# Patient Record
Sex: Female | Born: 1970 | Hispanic: No | Marital: Married | Smoking: Never smoker
Health system: Southern US, Community
[De-identification: ages and names within clinical notes are randomized; demographics above are authoritative.]

---

## 1998-07-11 ENCOUNTER — Other Ambulatory Visit: Admission: RE | Admit: 1998-07-11 | Discharge: 1998-07-11 | Payer: Self-pay | Admitting: *Deleted

## 1998-12-09 ENCOUNTER — Inpatient Hospital Stay (HOSPITAL_COMMUNITY): Admission: AD | Admit: 1998-12-09 | Discharge: 1998-12-12 | Payer: Self-pay | Admitting: Obstetrics and Gynecology

## 1999-02-23 ENCOUNTER — Other Ambulatory Visit: Admission: RE | Admit: 1999-02-23 | Discharge: 1999-02-23 | Payer: Self-pay | Admitting: *Deleted

## 2001-06-07 ENCOUNTER — Other Ambulatory Visit: Admission: RE | Admit: 2001-06-07 | Discharge: 2001-06-07 | Payer: Self-pay | Admitting: *Deleted

## 2005-12-08 ENCOUNTER — Other Ambulatory Visit: Admission: RE | Admit: 2005-12-08 | Discharge: 2005-12-08 | Payer: Self-pay | Admitting: Family Medicine

## 2005-12-08 ENCOUNTER — Ambulatory Visit: Payer: Self-pay | Admitting: Family Medicine

## 2016-12-07 ENCOUNTER — Ambulatory Visit: Payer: Self-pay

## 2016-12-07 ENCOUNTER — Ambulatory Visit (INDEPENDENT_AMBULATORY_CARE_PROVIDER_SITE_OTHER): Payer: Self-pay

## 2016-12-07 ENCOUNTER — Encounter: Payer: Self-pay | Admitting: Sports Medicine

## 2016-12-07 ENCOUNTER — Ambulatory Visit (INDEPENDENT_AMBULATORY_CARE_PROVIDER_SITE_OTHER): Payer: Self-pay | Admitting: Sports Medicine

## 2016-12-07 VITALS — BP 130/86 | HR 73 | Ht 61.0 in | Wt 167.4 lb

## 2016-12-07 DIAGNOSIS — M25561 Pain in right knee: Secondary | ICD-10-CM

## 2016-12-07 DIAGNOSIS — M25461 Effusion, right knee: Secondary | ICD-10-CM

## 2016-12-07 NOTE — Assessment & Plan Note (Signed)
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  Given the effusion aspiration injection performed today.  If any lack of improvement consider further evaluation with MRI but believe this is to be more of a degenerative meniscal issue instead of an acute bucket-handle type tear.   She should continue with compression which she is already using  Neuromuscular reeducation exercises including hip abduction and quad strengthening for improved biomechanics were reviewed in detail with athletic training staff today.  +++++++++++++++++++++++++++++++++++++++++++++++++++++++++++++++ PROCEDURE NOTE: THERAPEUTIC EXERCISES (97110) 15 minutes spent for Therapeutic exercises as stated in above notes.  This included exercises focusing on stretching, strengthening, with significant focus on eccentric aspects.   Proper technique shown and discussed handout in great detail with ATC.  All questions were discussed and answered.

## 2016-12-07 NOTE — Progress Notes (Signed)
OFFICE VISIT NOTE Melissa FellsMichael D. Delorise Shinerigby, DO  [facility] [phone]  Melissa JordanSu Y Mckenzie - 46 y.o. female MRN [id number]  Date of birth: 03/03/1971  Visit Date: 12/07/2016  PCP: No primary care provider on file.   Referred by: No ref. provider found  Orlie DakinBrandy Shelton, CMA acting as scribe for Dr. Berline Choughigby.  SUBJECTIVE:   Chief Complaint  Patient presents with  . pain in right knee   HPI: As below and per problem based documentation when appropriate.  Pt presents today with complaint of right knee pain.  She has been referred here by Dr. Darrick Pennaamien Rodolfo due to ongoing symptoms that are not improving with conservative measures. Pain started about 1.5 years ago. The pain resolved some about 6 months later but hasn't completely gone away.  Injury occurred after a fall while playing tennis.  She reports landing directly on the anterior aspect of her knee.  The pain is described as stiffness and minimal pain normally. There is more pain after exercise and going down stairs. Pain is rated as 5/10.  She does report occasionally the knee will buckle while going down steps she has not had any falls associated with this.  Occasional clicking but no locking  Worsened with going down stairs. Pt is unable to straighten the leg completely or bend the knee completely back. Pt has the most pain after a long game of tennis or doing a lot of lateral movement. There is also significant pain after doing squats.  Improves with icing the knee.  Therapies tried include : icing and stretching  Other associated symptoms include: Pt denies pain in ankle, leg, hips.  Pt denies fever, chills, night sweats, unintentional weight loss or gain.     Review of Systems  Constitutional: Negative for chills and fever.  Respiratory: Negative for shortness of breath and wheezing.   Cardiovascular: Negative for chest pain, palpitations and leg swelling.  Musculoskeletal: Negative for  falls.  Neurological: Negative for dizziness, tingling and headaches.  Endo/Heme/Allergies: Does not bruise/bleed easily.    Otherwise per HPI.  HISTORY & PERTINENT PRIOR DATA:  No specialty comments available. She reports that she has never smoked. She has never used smokeless tobacco. No results for input(s): HGBA1C, LABURIC in the last 8760 hours. Medications & Allergies reviewed per EMR Patient Active Problem List   Diagnosis Date Noted  . Right knee pain 12/07/2016  . Effusion of right knee 12/07/2016   History reviewed. No pertinent past medical history. History reviewed. No pertinent family history. Past Surgical History:  Procedure Laterality Date  . CESAREAN SECTION     Social History   Occupational History  . Not on file.   Social History Main Topics  . Smoking status: Never Smoker  . Smokeless tobacco: Never Used  . Alcohol use Not on file  . Drug use: Unknown  . Sexual activity: Not on file    OBJECTIVE:  VS:  HT:5\' 1"  (154.9 cm)   WT:167 lb 6.4 oz (75.9 kg)  BMI:31.7    BP:130/86  HR:73bpm  TEMP: ( )  RESP:97 % EXAM: Findings:  WDWN, NAD, Non-toxic appearing Alert & appropriately interactive Not depressed or anxious appearing No increased work of breathing. Pupils are equal. EOM intact without nystagmus No clubbing or cyanosis of the extremities appreciated No significant rashes/lesions/ulcerations overlying the examined area. DP & PT pulses 2+/4.  No significant pretibial edema. Sensation intact to light touch in lower extremities.  Right Knee:  Overall joint is well aligned, no significant deformity.   Small supraphysiologic effusion with moderate synovitis.   ROM: 3 to 115.   Extensor mechanism intact Small amount of medial joint line pain that is generalized.  Pain with McMurray's localized over the medial joint line..   Stable to varus/valgus strain & anterior/posterior drawer.  Slightly lax Lachman's without a solid endpoint put  constrained knee at baseline.  This is similar to the left knee which is asymptomatic.Marland Kitchen          No results found. 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  Given the effusion aspiration injection performed today.  If any lack of improvement consider further evaluation with MRI but believe this is to be more of a degenerative meniscal issue instead of an acute bucket-handle type tear.   She should continue with compression which she is already using  Neuromuscular reeducation exercises including hip abduction and quad strengthening for improved biomechanics were reviewed in detail with athletic training staff today.  +++++++++++++++++++++++++++++++++++++++++++++++++++++++++++++++ PROCEDURE NOTE: THERAPEUTIC EXERCISES (97110) 15 minutes spent for Therapeutic exercises as stated in above notes.  This included exercises focusing on stretching, strengthening, with significant focus on eccentric aspects.   Proper technique shown and discussed handout in great detail with ATC.  All questions were discussed and answered.        Relevant Orders   DG Knee AP/LAT W/Sunrise Right   US GUIDED NEEDLE PLACEMENT(NO LINKED CHARGES)   Effusion of right knee      Follow-up: Return in about 6 weeks (around 01/18/2017), or if symptoms worsen or fail to improve.   CMA/ATC served as Neurosurgeon during this visit. History, Physical, and Plan performed by medical provider. Documentation and orders reviewed and attested to.      Gaspar Bidding, DO    [facility]

## 2016-12-07 NOTE — Patient Instructions (Signed)
Please perform the exercise program that Fayrene FearingJames has prepared for you and gone over in detail on a daily basis.  In addition to the handout you were provided you can access your program through: [email or web address]   Your unique program code is: [email or web address]

## 2017-01-18 ENCOUNTER — Ambulatory Visit: Payer: Self-pay | Admitting: Sports Medicine

## 2017-01-18 NOTE — Progress Notes (Deleted)
  OFFICE VISIT NOTE Melissa FellsMichael D. Melissa Mckenzie Shinerigby, DO  [facility] [phone]  Melissa JordanSu Y Mckenzie - 46 y.o. female MRN [id number]  Date of birth: [birth year]  Visit Date: 01/18/2017  PCP: No primary care provider on file.   Referred by: No ref. provider found  Melissa DakinBrandy Mckenzie, CMA acting as scribe for Dr. Berline Choughigby.  SUBJECTIVE:  No chief complaint on file.  HPI: As below and per problem based documentation when appropriate.  Pt presents today for 6 week follow-up of right knee pain. Pt has ultrasound  12/07/16 which showed the following:  radio occult degenerative changes as well as degenerative meniscal bulge. Xray was also done 12/07/16 and showed the following:  IMPRESSION: Moderate joint effusion.  No acute bony abnormality. She had aspiration and steroid injection 12/07/16. She was given home exercises and advised to continue using compression around the knee.     ROS  Otherwise per HPI.  HISTORY & PERTINENT PRIOR DATA:  No specialty comments available. She reports that she has never smoked. She has never used smokeless tobacco. No results for input(s): HGBA1C, LABURIC in the last 8760 hours. Medications & Allergies reviewed per EMR Patient Active Problem List   Diagnosis Date Noted  . Right knee pain 12/07/2016  . Effusion of right knee 12/07/2016   No past medical history on file. No family history on file. Past Surgical History:  Procedure Laterality Date  . CESAREAN SECTION     Social History   Occupational History  . Not on file.   Social History Main Topics  . Smoking status: Never Smoker  . Smokeless tobacco: Never Used  . Alcohol use Not on file  . Drug use: Unknown  . Sexual activity: Not on file    OBJECTIVE:  VS:  HT:    WT:   BMI:     BP:   HR: bpm  TEMP: ( )  RESP:  EXAM: No additional findings.    No results found. ASSESSMENT & PLAN:  { }

## 2017-02-11 ENCOUNTER — Telehealth: Payer: Self-pay | Admitting: Sports Medicine

## 2017-02-11 NOTE — Telephone Encounter (Signed)
Patient calling about no show charge for 07/17 date. She never got a reminder call and it's her first no show w/ Berline Choughigby.  Advised charge would be removed.  Ty,  -LL

## 2017-07-14 ENCOUNTER — Ambulatory Visit: Payer: Self-pay | Admitting: Family Medicine

## 2017-07-14 ENCOUNTER — Encounter: Payer: Self-pay | Admitting: Family Medicine

## 2017-07-14 VITALS — BP 118/80 | HR 75 | Temp 97.6°F | Ht 61.0 in | Wt 169.4 lb

## 2017-07-14 DIAGNOSIS — J45909 Unspecified asthma, uncomplicated: Secondary | ICD-10-CM | POA: Insufficient documentation

## 2017-07-14 DIAGNOSIS — J4521 Mild intermittent asthma with (acute) exacerbation: Secondary | ICD-10-CM

## 2017-07-14 MED ORDER — AZITHROMYCIN 250 MG PO TABS: ORAL_TABLET | ORAL | 0 refills | Status: DC

## 2017-07-14 MED ORDER — PREDNISONE 10 MG PO TABS: 10.0000 mg | ORAL_TABLET | Freq: Two times a day (BID) | ORAL | 0 refills | Status: AC

## 2017-07-14 NOTE — Progress Notes (Signed)
Subjective:  Patient ID: Melissa Mckenzie, female    DOB: 1971-03-20  Age: 47 y.o. MRN: [id number]  CC: Establish Care   HPI Melissa Mckenzie presents for a one-week history of a cough that is now retired productive of cream-colored phlegm.  She has felt a little tight and wheezy as well.  She has no history of asthma, fever chills, hemoptysis or weight loss.  She does not smoke and is not exposed to cigarette smoke. She had a similar episode back in November that entirely cleared but also required an antibiotic.  Her husband and children have flulike symptoms that had started 3 days ago.  There is no family history of asthma.  She is otherwise healthy.  She denies reflux symptoms.  They have dogs at home and she does not seem to be sensitive to them. LMP is now x 2 days.   History Melissa Mckenzie has no past medical history on file.   She has a past surgical history that includes Cesarean section.   Her family history is not on file.She reports that  has never smoked. she has never used smokeless tobacco. Her alcohol and drug histories are not on file.  No outpatient medications prior to visit.   No facility-administered medications prior to visit.     ROS Review of Systems  Constitutional: Negative for chills.  HENT: Negative for congestion, postnasal drip, sinus pressure, sinus pain and trouble swallowing.   Eyes: Negative for photophobia and visual disturbance.  Respiratory: Positive for cough and wheezing. Negative for chest tightness and shortness of breath.   Cardiovascular: Negative.   Gastrointestinal: Negative.   Musculoskeletal: Negative for arthralgias and myalgias.  Skin: Negative for pallor and rash.  Neurological: Negative for headaches.  Hematological: Does not bruise/bleed easily.  Psychiatric/Behavioral: Negative.     Objective:  BP 118/80 (BP Location: Right Arm, Patient Position: Sitting, Cuff Size: Normal)   Pulse 75   Temp 97.6 F (36.4 C) (Oral)   Ht 5\' 1"  (1.549 m)   Wt 169 lb  6 oz (76.8 kg)   SpO2 98%   BMI 32.00 kg/m   Physical Exam  Constitutional: She is oriented to person, place, and time. She appears well-developed and well-nourished. No distress.  HENT:  Head: Normocephalic and atraumatic.  Right Ear: External ear normal.  Left Ear: External ear normal.  Mouth/Throat: Oropharynx is clear and moist. No oropharyngeal exudate.  Eyes: Conjunctivae are normal. Pupils are equal, round, and reactive to light. Right eye exhibits no discharge. Left eye exhibits no discharge. No scleral icterus.  Neck: Neck supple. No JVD present. No tracheal deviation present. No thyromegaly present.  Cardiovascular: Normal rate, regular rhythm and normal heart sounds.  Pulmonary/Chest: Effort normal and breath sounds normal. No stridor. No respiratory distress. She has no wheezes. She has no rales.  Lymphadenopathy:    She has no cervical adenopathy.  Neurological: She is alert and oriented to person, place, and time.  Skin: Skin is warm and dry. She is not diaphoretic.  Psychiatric: She has a normal mood and affect. Her behavior is normal.      Assessment & Plan:   Melissa Mckenzie was seen today for establish care.  Diagnoses and all orders for this visit:  Mild intermittent asthmatic bronchitis with acute exacerbation -     azithromycin (ZITHROMAX) 250 MG tablet; Take 2 today and then one each day for 4 more days. -     predniSONE (DELTASONE) 10 MG tablet; Take 1 tablet (10  mg total) by mouth 2 (two) times daily with a meal for 7 days.   I am having Melissa Mckenzie start on azithromycin and predniSONE.  Meds ordered this encounter  Medications  . azithromycin (ZITHROMAX) 250 MG tablet    Sig: Take 2 today and then one each day for 4 more days.    Dispense:  6 tablet    Refill:  0  . predniSONE (DELTASONE) 10 MG tablet    Sig: Take 1 tablet (10 mg total) by mouth 2 (two) times daily with a meal for 7 days.    Dispense:  14 tablet    Refill:  0   She is to follow-up in one  week if not improved.   Follow-up: No Follow-up on file.  Mliss Sax, MD

## 2018-10-23 ENCOUNTER — Telehealth: Payer: Self-pay | Admitting: Family Medicine

## 2018-10-23 NOTE — Telephone Encounter (Signed)
Called pt on behalf of Dr Doreene Burke since it has been a while, She said she will call back at a later date to schedule her annual

## 2021-05-13 ENCOUNTER — Ambulatory Visit: Payer: Self-pay | Admitting: Internal Medicine

## 2021-10-23 ENCOUNTER — Encounter: Payer: Self-pay | Admitting: Internal Medicine

## 2021-10-23 ENCOUNTER — Ambulatory Visit (INDEPENDENT_AMBULATORY_CARE_PROVIDER_SITE_OTHER): Payer: 59 | Admitting: Internal Medicine

## 2021-10-23 VITALS — BP 120/88 | HR 61 | Resp 18 | Ht 62.0 in | Wt 168.6 lb

## 2021-10-23 DIAGNOSIS — Z13220 Encounter for screening for lipoid disorders: Secondary | ICD-10-CM

## 2021-10-23 DIAGNOSIS — Z1211 Encounter for screening for malignant neoplasm of colon: Secondary | ICD-10-CM | POA: Diagnosis not present

## 2021-10-23 DIAGNOSIS — Z0000 Encounter for general adult medical examination without abnormal findings: Secondary | ICD-10-CM | POA: Insufficient documentation

## 2021-10-23 LAB — LIPID PANEL
Cholesterol: 173 mg/dL (ref 0–200)
HDL: 68.9 mg/dL (ref >39.00)
LDL Cholesterol: 80 mg/dL (ref 0–99)
NonHDL: 103.6
Total CHOL/HDL Ratio: 3
Triglycerides: 116 mg/dL (ref 0.0–149.0)
VLDL: 23.2 mg/dL (ref 0.0–40.0)

## 2021-10-23 LAB — COMPREHENSIVE METABOLIC PANEL
ALT: 12 U/L (ref 0–35)
AST: 18 U/L (ref 0–37)
Albumin: 4.4 g/dL (ref 3.5–5.2)
Alkaline Phosphatase: 61 U/L (ref 39–117)
BUN: 17 mg/dL (ref 6–23)
CO2: 25 mEq/L (ref 19–32)
Calcium: 9.4 mg/dL (ref 8.4–10.5)
Chloride: 105 mEq/L (ref 96–112)
Creatinine, Ser: 0.72 mg/dL (ref 0.40–1.20)
GFR: 96.09 mL/min (ref >60.00)
Glucose, Bld: 114 mg/dL — ABNORMAL HIGH (ref 70–99)
Potassium: 3.8 mEq/L (ref 3.5–5.1)
Sodium: 138 mEq/L (ref 135–145)
Total Bilirubin: 0.6 mg/dL (ref 0.2–1.2)
Total Protein: 7.5 g/dL (ref 6.0–8.3)

## 2021-10-23 LAB — CBC
HCT: 39.2 % (ref 36.0–46.0)
Hemoglobin: 13.2 g/dL (ref 12.0–15.0)
MCHC: 33.6 g/dL (ref 30.0–36.0)
MCV: 89.2 fl (ref 78.0–100.0)
Platelets: 256 10*3/uL (ref 150.0–400.0)
RBC: 4.4 Mil/uL (ref 3.87–5.11)
RDW: 13.6 % (ref 11.5–15.5)
WBC: 7.1 10*3/uL (ref 4.0–10.5)

## 2021-10-23 NOTE — Progress Notes (Signed)
? ?  Subjective:  ? ?Patient ID: Melissa Mckenzie, female    DOB: [birth year], 51 y.o.   MRN: [id number] ? ?HPI ?The patient is a new 51 YO female coming in for physical. ? ?PMH, [facility], social history reviewed and updated ? ?Review of Systems  ?Constitutional: Negative.   ?HENT: Negative.    ?Eyes: Negative.   ?Respiratory:  Negative for cough, chest tightness and shortness of breath.   ?Cardiovascular:  Negative for chest pain, palpitations and leg swelling.  ?Gastrointestinal:  Negative for abdominal distention, abdominal pain, constipation, diarrhea, nausea and vomiting.  ?Musculoskeletal: Negative.   ?Skin: Negative.   ?Neurological: Negative.   ?Psychiatric/Behavioral: Negative.    ? ?Objective:  ?Physical Exam ?Constitutional:   ?   Appearance: She is well-developed.  ?HENT:  ?   Head: Normocephalic and atraumatic.  ?Cardiovascular:  ?   Rate and Rhythm: Normal rate and regular rhythm.  ?Pulmonary:  ?   Effort: Pulmonary effort is normal. No respiratory distress.  ?   Breath sounds: Normal breath sounds. No wheezing or rales.  ?Abdominal:  ?   General: Bowel sounds are normal. There is no distension.  ?   Palpations: Abdomen is soft.  ?   Tenderness: There is no abdominal tenderness. There is no rebound.  ?Musculoskeletal:  ?   Cervical back: Normal range of motion.  ?Skin: ?   General: Skin is warm and dry.  ?Neurological:  ?   Mental Status: She is alert and oriented to person, place, and time.  ?   Coordination: Coordination normal.  ? ? ?Vitals:  ? 10/23/21 0854  ?BP: 120/88  ?Pulse: 61  ?Resp: 18  ?SpO2: 98%  ?Weight: 168 lb 9.6 oz (76.5 kg)  ?Height: 5\' 2"  (1.575 m)  ? ? ?This visit occurred during the SARS-CoV-2 public health emergency.  Safety protocols were in place, including screening questions prior to the visit, additional usage of staff PPE, and extensive cleaning of exam room while observing appropriate contact time as indicated for disinfecting solutions.  ? ?Assessment & Plan:  ? ?

## 2021-10-23 NOTE — Assessment & Plan Note (Signed)
Flu shot yearly. Covid-19 counseled. Shingrix counseled wishes to think about it. Tetanus counseled wishes to think about it. Cologuard ordered. Mammogram ordered, pap smear counseled wishes to think about it. Counseled about sun safety and mole surveillance. Counseled about the dangers of distracted driving. Given 10 year screening recommendations.  ? ?

## 2021-10-23 NOTE — Patient Instructions (Addendum)
We will have the colon cancer screening sent to the house. ? ?We will have them call you for a mammogram. ? ?Think about the shingles vaccine and the tetanus vaccine. ? ?You are due for a pap smear.  ?

## 2021-10-26 ENCOUNTER — Other Ambulatory Visit (INDEPENDENT_AMBULATORY_CARE_PROVIDER_SITE_OTHER): Payer: 59

## 2021-10-26 ENCOUNTER — Other Ambulatory Visit: Payer: Self-pay | Admitting: Internal Medicine

## 2021-10-26 DIAGNOSIS — R7301 Impaired fasting glucose: Secondary | ICD-10-CM | POA: Diagnosis not present

## 2021-10-26 LAB — HEMOGLOBIN A1C: Hgb A1c MFr Bld: 6.3 % (ref 4.6–6.5)

## 2021-10-29 ENCOUNTER — Encounter: Payer: Self-pay | Admitting: Internal Medicine

## 2021-11-11 LAB — COLOGUARD: COLOGUARD: NEGATIVE

## 2021-11-19 ENCOUNTER — Ambulatory Visit (INDEPENDENT_AMBULATORY_CARE_PROVIDER_SITE_OTHER): Payer: 59

## 2021-11-19 DIAGNOSIS — Z1231 Encounter for screening mammogram for malignant neoplasm of breast: Secondary | ICD-10-CM

## 2021-11-19 DIAGNOSIS — Z0000 Encounter for general adult medical examination without abnormal findings: Secondary | ICD-10-CM

## 2021-12-02 MED ORDER — METFORMIN HCL ER 500 MG PO TB24: 500.0000 mg | ORAL_TABLET | Freq: Every day | ORAL | 1 refills | Status: DC

## 2022-04-30 ENCOUNTER — Ambulatory Visit: Payer: 59 | Admitting: Internal Medicine

## 2022-05-26 ENCOUNTER — Encounter: Payer: Self-pay | Admitting: Internal Medicine

## 2022-05-26 ENCOUNTER — Ambulatory Visit: Payer: 59 | Admitting: Internal Medicine

## 2022-05-26 VITALS — BP 128/62 | HR 69 | Temp 98.4°F | Ht 62.0 in | Wt 170.0 lb

## 2022-05-26 DIAGNOSIS — R7303 Prediabetes: Secondary | ICD-10-CM | POA: Insufficient documentation

## 2022-05-26 LAB — HEMOGLOBIN A1C: Hgb A1c MFr Bld: 6.3 % (ref 4.6–6.5)

## 2022-05-26 MED ORDER — METFORMIN HCL ER 500 MG PO TB24
500.0000 mg | ORAL_TABLET | Freq: Every day | ORAL | 1 refills | Status: DC
Start: 2022-05-26 — End: 2022-05-31

## 2022-05-26 NOTE — Assessment & Plan Note (Signed)
Checking HgA1c today and will adjust metformin 500 mg daily as needed. She is taking this for prevention.

## 2022-05-26 NOTE — Progress Notes (Signed)
   Subjective:   Patient ID: Melissa Mckenzie, female    DOB: Dec 13, 1970, 51 y.o.   MRN: [id number]  HPI The patient is a 51 YO female coming in for follow up pre-diabetes.Started metformin for prevention back in May and needs recheck.   Review of Systems  Constitutional: Negative.   HENT: Negative.    Eyes: Negative.   Respiratory:  Negative for cough, chest tightness and shortness of breath.   Cardiovascular:  Negative for chest pain, palpitations and leg swelling.  Gastrointestinal:  Negative for abdominal distention, abdominal pain, constipation, diarrhea, nausea and vomiting.  Musculoskeletal: Negative.   Skin: Negative.   Neurological: Negative.   Psychiatric/Behavioral: Negative.      Objective:  Physical Exam Constitutional:      Appearance: Normal appearance.  HENT:     Head: Normocephalic.  Cardiovascular:     Rate and Rhythm: Normal rate and regular rhythm.  Pulmonary:     Effort: Pulmonary effort is normal.  Musculoskeletal:        General: Normal range of motion.  Skin:    General: Skin is warm and dry.  Neurological:     General: No focal deficit present.     Mental Status: She is alert and oriented to person, place, and time.     Vitals:   05/26/22 0843  BP: 128/62  Pulse: 69  Temp: 98.4 F (36.9 C)  TempSrc: Oral  SpO2: 96%  Weight: 170 lb (77.1 kg)  Height: 5\' 2"  (1.575 m)    Assessment & Plan:

## 2022-05-31 MED ORDER — METFORMIN HCL ER 500 MG PO TB24: 1000.0000 mg | ORAL_TABLET | Freq: Every day | ORAL | 1 refills | Status: DC

## 2022-07-03 ENCOUNTER — Encounter: Payer: Self-pay | Admitting: Internal Medicine

## 2022-07-08 ENCOUNTER — Other Ambulatory Visit: Payer: Self-pay

## 2022-07-08 MED ORDER — METFORMIN HCL ER 500 MG PO TB24: 1000.0000 mg | ORAL_TABLET | Freq: Every day | ORAL | 1 refills | Status: AC

## 2022-10-06 ENCOUNTER — Encounter: Payer: Self-pay | Admitting: Internal Medicine

## 2023-11-19 IMAGING — MG MM DIGITAL SCREENING BILAT W/ TOMO AND CAD
6 of 10 series · 6 of 30 positions shown · non-contrast
Comparison: None available.

CLINICAL DATA: Screening.

EXAM:
DIGITAL SCREENING BILATERAL MAMMOGRAM WITH TOMOSYNTHESIS AND CAD
TECHNIQUE: Bilateral screening digital craniocaudal and mediolateral oblique
mammograms were obtained. Bilateral screening digital breast
tomosynthesis was performed. The images were evaluated with
computer-aided detection.

[L MLO synth-2D (1 of 2)]
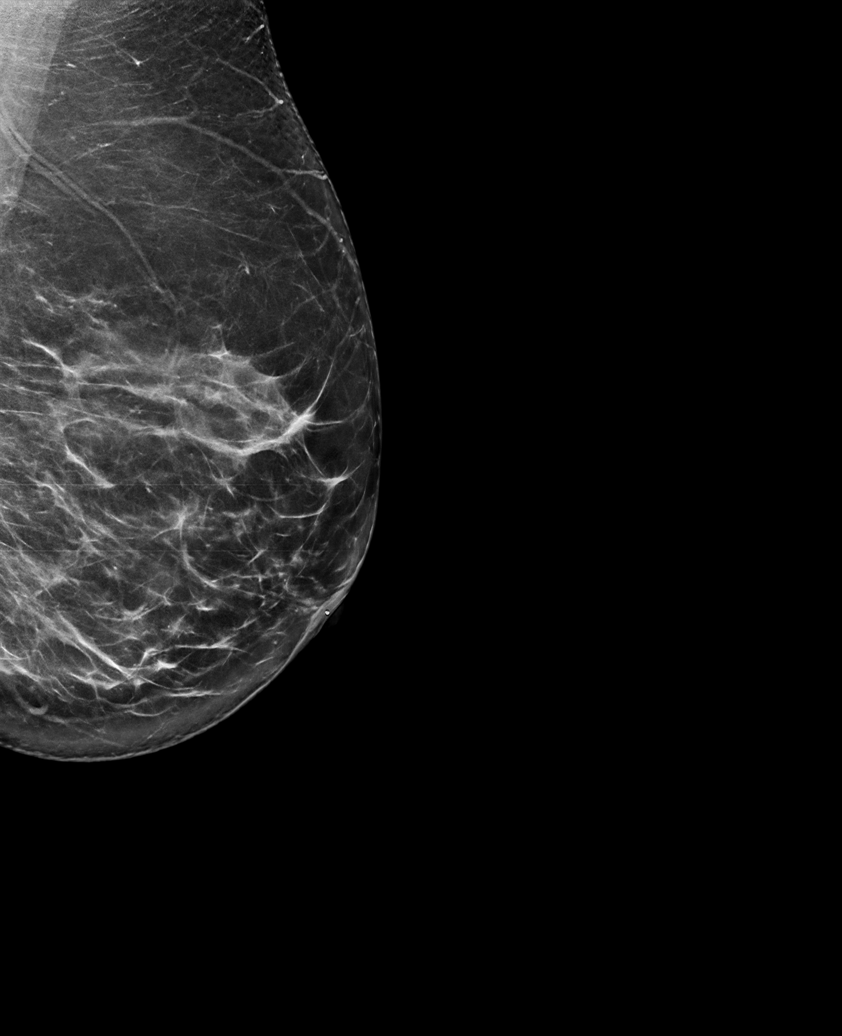

[R CC synth-2D]
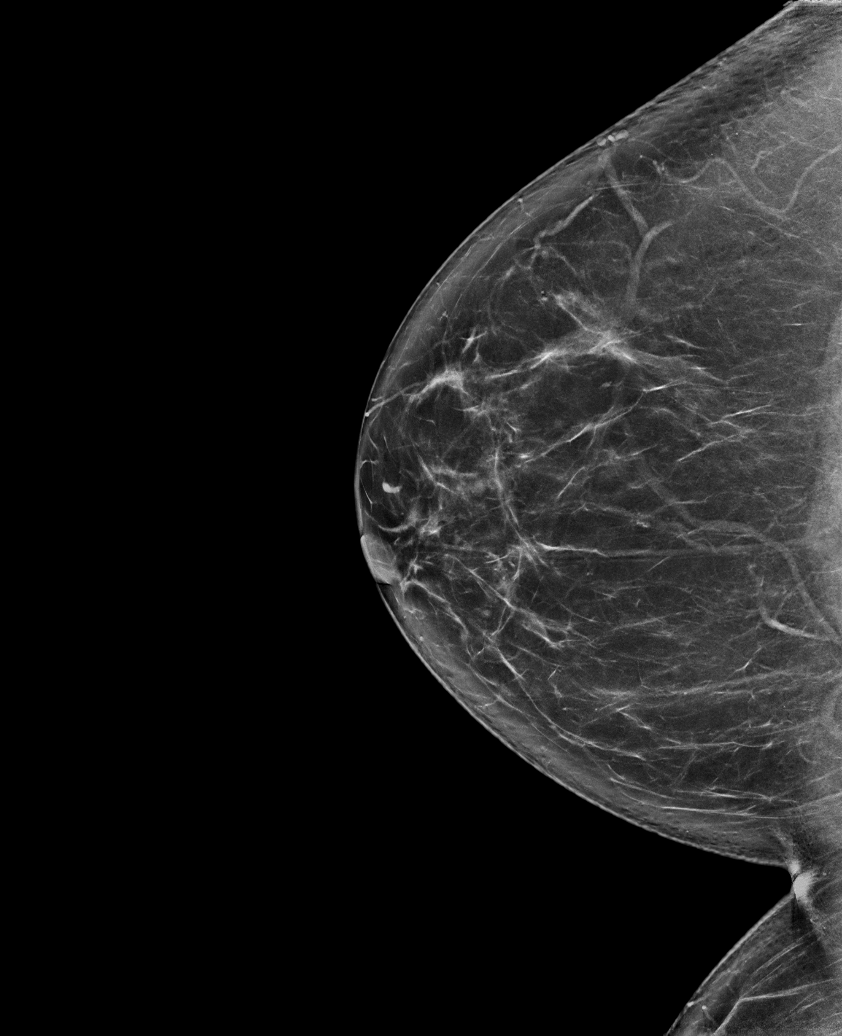

[R MLO synth-2D]
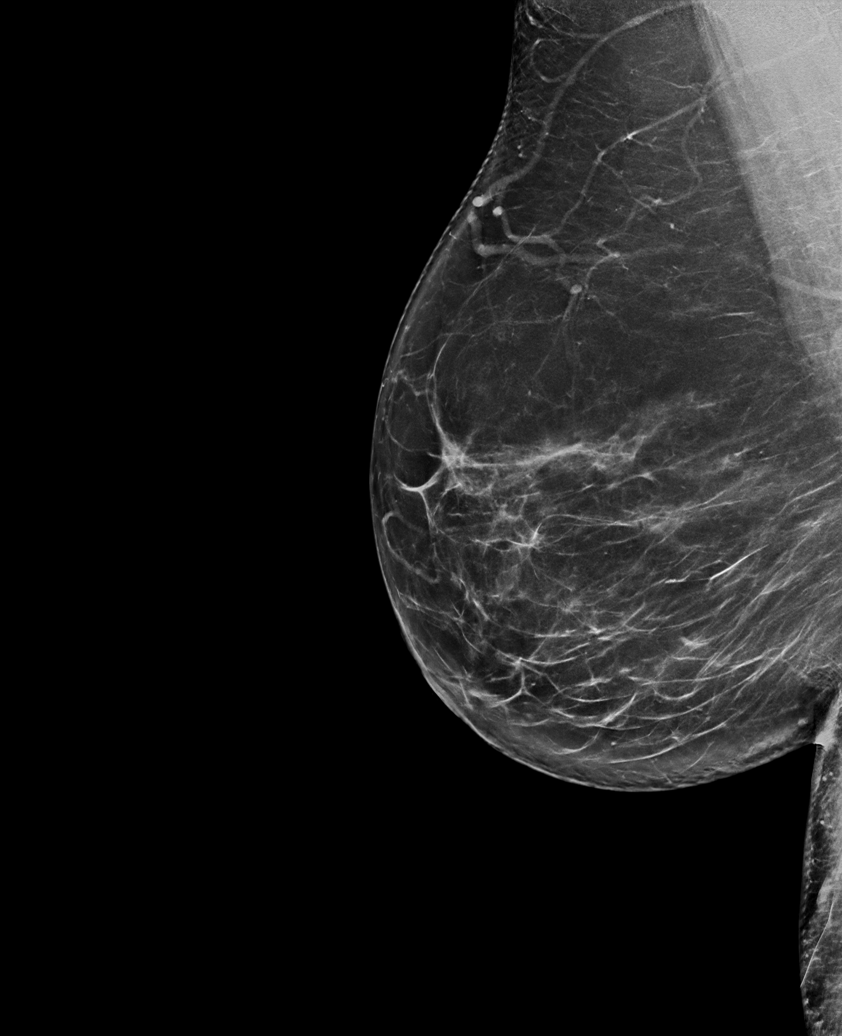

[L MLO synth-2D (2 of 2)]
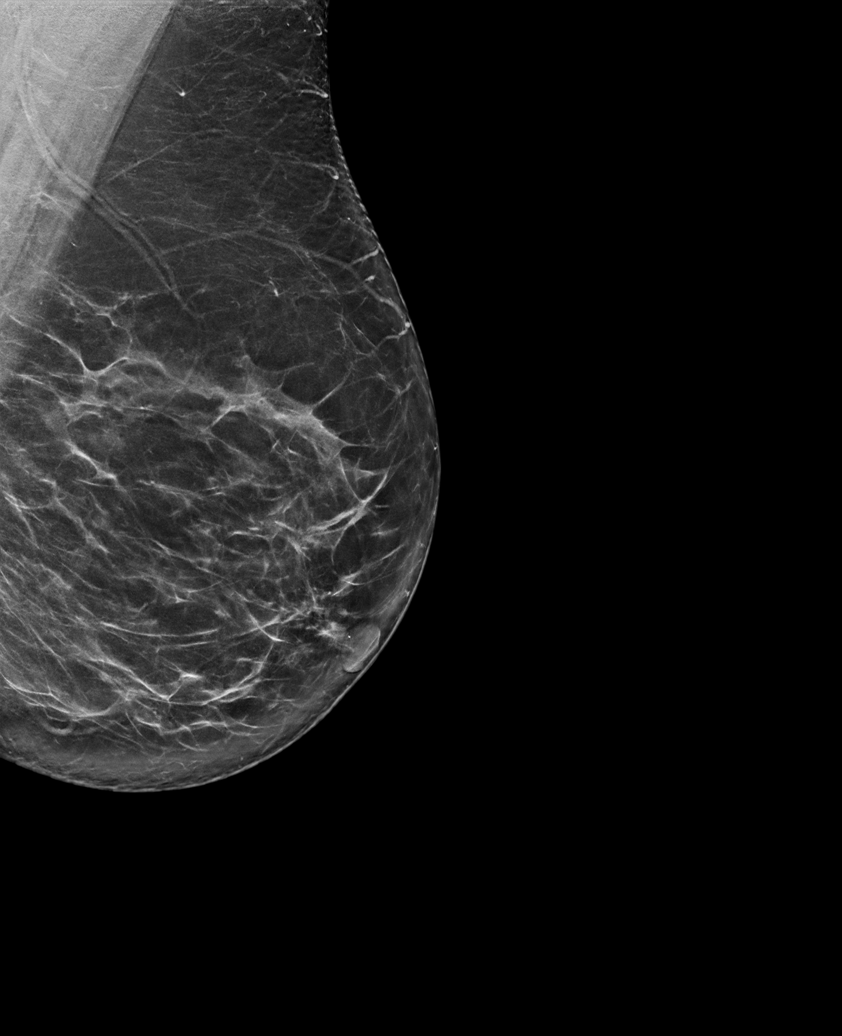

[L CC synth-2D]
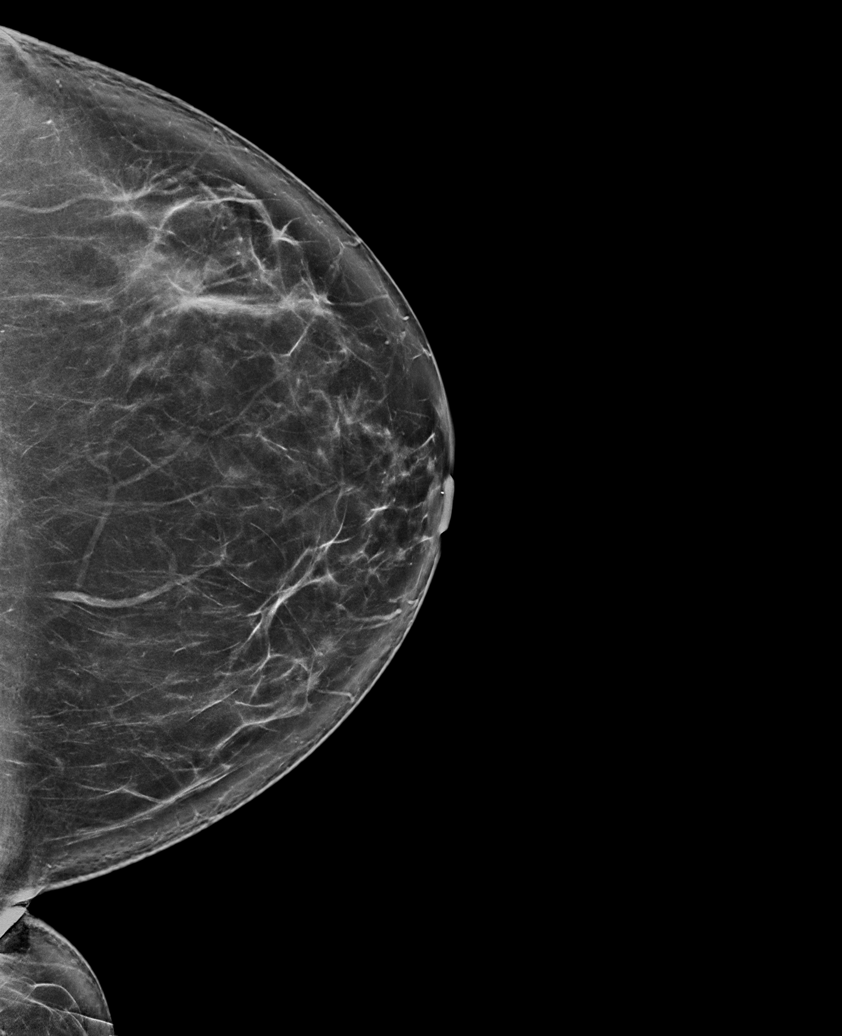

[R CC tomo · tomo slice 43/86.0]
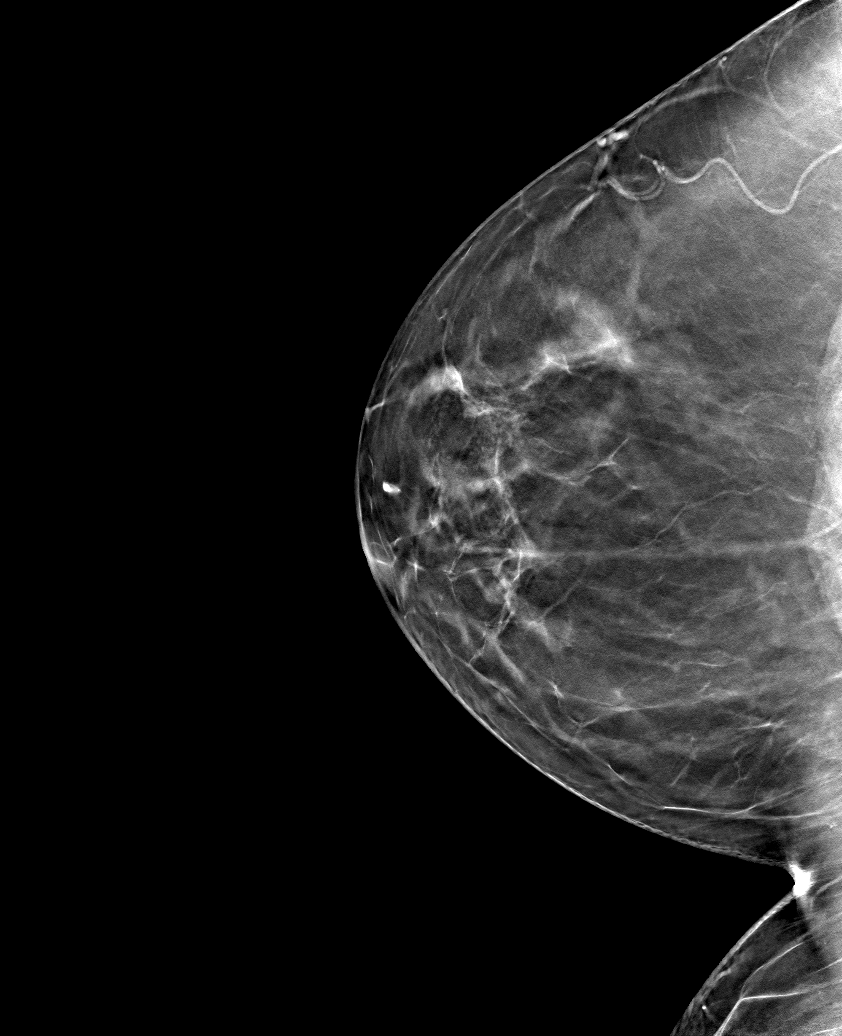

[6 of 30 positions shown; findings below may reference images not displayed]

ACR Breast Density Category b: There are scattered areas of
fibroglandular density.
FINDINGS: There are no findings suspicious for malignancy.
IMPRESSION: No mammographic evidence of malignancy. A result letter of this
screening mammogram will be mailed directly to the patient.

RECOMMENDATION:
Screening mammogram in one year. (Code:GB-Z-FIU)

BI-RADS CATEGORY  1: Negative.
